# Patient Record
Sex: Female | Born: 1935 | Race: White | Hispanic: No | Marital: Married
Health system: Southern US, Community
[De-identification: ages and names within clinical notes are randomized; demographics above are authoritative.]

---

## 2005-12-13 ENCOUNTER — Emergency Department: Payer: Self-pay | Admitting: General Practice

## 2006-11-19 ENCOUNTER — Emergency Department: Payer: Self-pay | Admitting: Emergency Medicine

## 2006-11-19 ENCOUNTER — Other Ambulatory Visit: Payer: Self-pay

## 2007-05-04 ENCOUNTER — Other Ambulatory Visit: Payer: Self-pay

## 2007-05-05 ENCOUNTER — Inpatient Hospital Stay: Payer: Self-pay | Admitting: Internal Medicine

## 2007-07-11 ENCOUNTER — Emergency Department: Payer: Self-pay | Admitting: Emergency Medicine

## 2007-08-25 ENCOUNTER — Ambulatory Visit: Payer: Self-pay | Admitting: Internal Medicine

## 2007-09-24 ENCOUNTER — Ambulatory Visit: Payer: Self-pay | Admitting: Gastroenterology

## 2007-10-01 ENCOUNTER — Ambulatory Visit: Payer: Self-pay | Admitting: Gastroenterology

## 2008-01-26 ENCOUNTER — Ambulatory Visit: Payer: Self-pay | Admitting: Internal Medicine

## 2009-08-07 ENCOUNTER — Ambulatory Visit: Payer: Self-pay | Admitting: Internal Medicine

## 2012-04-10 LAB — CBC
MCH: 29 pg (ref 26.0–34.0)
MCHC: 33.1 g/dL (ref 32.0–36.0)
Platelet: 268 10*3/uL (ref 150–440)
RBC: 4.21 10*6/uL (ref 3.80–5.20)
RDW: 14.7 % — ABNORMAL HIGH (ref 11.5–14.5)
WBC: 9.4 10*3/uL (ref 3.6–11.0)

## 2012-04-10 LAB — URINALYSIS, COMPLETE
Ketone: NEGATIVE
Nitrite: NEGATIVE
RBC,UR: 3 /HPF (ref 0–5)
Specific Gravity: 1.01 (ref 1.003–1.030)

## 2012-04-10 LAB — COMPREHENSIVE METABOLIC PANEL
Albumin: 3.9 g/dL (ref 3.4–5.0)
Anion Gap: 9 (ref 7–16)
BUN: 14 mg/dL (ref 7–18)
Bilirubin,Total: 0.8 mg/dL (ref 0.2–1.0)
Calcium, Total: 9.2 mg/dL (ref 8.5–10.1)
Chloride: 105 mmol/L (ref 98–107)
Creatinine: 0.92 mg/dL (ref 0.60–1.30)
EGFR (Non-African Amer.): >60
Glucose: 89 mg/dL (ref 65–99)
Osmolality: 281 (ref 275–301)
SGOT(AST): 26 U/L (ref 15–37)

## 2012-04-10 LAB — TROPONIN I: Troponin-I: <0.02 ng/mL

## 2012-04-11 ENCOUNTER — Inpatient Hospital Stay: Payer: Self-pay | Admitting: Internal Medicine

## 2012-04-11 LAB — COMPREHENSIVE METABOLIC PANEL
Albumin: 3.1 g/dL — ABNORMAL LOW (ref 3.4–5.0)
Anion Gap: 7 (ref 7–16)
BUN: 10 mg/dL (ref 7–18)
Calcium, Total: 8 mg/dL — ABNORMAL LOW (ref 8.5–10.1)
Creatinine: 0.81 mg/dL (ref 0.60–1.30)
EGFR (Non-African Amer.): >60
Glucose: 119 mg/dL — ABNORMAL HIGH (ref 65–99)
Osmolality: 283 (ref 275–301)
SGOT(AST): 21 U/L (ref 15–37)
Sodium: 142 mmol/L (ref 136–145)
Total Protein: 6.2 g/dL — ABNORMAL LOW (ref 6.4–8.2)

## 2012-04-11 LAB — CBC
HCT: 32.5 % — ABNORMAL LOW (ref 35.0–47.0)
HGB: 10.1 g/dL — ABNORMAL LOW (ref 12.0–16.0)
MCHC: 31 g/dL — ABNORMAL LOW (ref 32.0–36.0)

## 2012-04-11 LAB — CK TOTAL AND CKMB (NOT AT ARMC)
CK, Total: 69 U/L (ref 21–215)
CK-MB: 1 ng/mL (ref 0.5–3.6)

## 2012-04-12 LAB — COMPREHENSIVE METABOLIC PANEL
Anion Gap: 5 — ABNORMAL LOW (ref 7–16)
BUN: 5 mg/dL — ABNORMAL LOW (ref 7–18)
Bilirubin,Total: 0.8 mg/dL (ref 0.2–1.0)
Calcium, Total: 9.4 mg/dL (ref 8.5–10.1)
Chloride: 107 mmol/L (ref 98–107)
Co2: 29 mmol/L (ref 21–32)
Creatinine: 0.88 mg/dL (ref 0.60–1.30)
EGFR (African American): >60
EGFR (Non-African Amer.): >60
Osmolality: 280 (ref 275–301)
Potassium: 4.6 mmol/L (ref 3.5–5.1)
SGPT (ALT): 26 U/L (ref 12–78)

## 2012-04-12 LAB — CBC WITH DIFFERENTIAL/PLATELET
Basophil %: 0.4 %
Eosinophil #: 0.1 10*3/uL (ref 0.0–0.7)
HCT: 40.6 % (ref 35.0–47.0)
HGB: 13 g/dL (ref 12.0–16.0)
Lymphocyte %: 26.5 %
MCHC: 31.9 g/dL — ABNORMAL LOW (ref 32.0–36.0)
MCV: 88 fL (ref 80–100)
Monocyte #: 0.7 x10 3/mm (ref 0.2–0.9)
Monocyte %: 9.1 %
Neutrophil %: 62.8 %
RBC: 4.6 10*6/uL (ref 3.80–5.20)
WBC: 8.1 10*3/uL (ref 3.6–11.0)

## 2012-04-13 LAB — COMPREHENSIVE METABOLIC PANEL
Albumin: 2.9 g/dL — ABNORMAL LOW (ref 3.4–5.0)
Alkaline Phosphatase: 77 U/L (ref 50–136)
Anion Gap: 8 (ref 7–16)
Bilirubin,Total: 0.6 mg/dL (ref 0.2–1.0)
Calcium, Total: 8.7 mg/dL (ref 8.5–10.1)
Creatinine: 0.97 mg/dL (ref 0.60–1.30)
EGFR (African American): >60
Osmolality: 275 (ref 275–301)
Potassium: 4.1 mmol/L (ref 3.5–5.1)
SGPT (ALT): 20 U/L (ref 12–78)
Sodium: 139 mmol/L (ref 136–145)
Total Protein: 6.5 g/dL (ref 6.4–8.2)

## 2012-04-15 LAB — AFP TUMOR MARKER: AFP-Tumor Marker: 5 ng/mL (ref 0.0–8.3)

## 2014-07-15 NOTE — H&P (Signed)
PATIENT NAME:  Monique Allen, Monique Allen MR#:  [id number] DATE OF BIRTH:  05/20/1935  DATE OF ADMISSION:  04/11/2012  PRIMARY CARE PHYSICIAN: Dr. Alonna BucklerAndrew Lamb.   REFERRING PHYSICIAN: Dr. Brien MatesBraud.   CHIEF COMPLAINT: Right severe abdominal pain.   HISTORY OF PRESENT ILLNESS: The patient is a 79 year old Caucasian female, who is a poor historian from chronic history of dementia, was brought into the ER with a chief complaint of right-sided abdominal pain by her husband. According to the husband, the husband reports to the ER physician the patient has been experiencing right-sided abdominal pain for the past 48 hours. He also reported to the ER physician that the patient developed significant lower abdominal pain on and off. This pain was associated with nausea and while she was sent over to the Radiology Department, she vomited. The patient being a poor historian, I was able to obtain somewhat little history from the patient. No family members were available at the bedside. Her lab work was in the normal range. The patient was given IV narcotics with a temporary relief of abdominal pain. The patient initially had a CAT scan of the abdomen and pelvis without contrast, which has revealed large hyperdensities in liver likely reflects cysts with the largest having dimensions of 15 cm transversely and 8.3 cm. They have recommended to get a CT scan of the abdomen with p.o. and IV contrast for more details. The patient eventually had a CAT scan of the abdomen and pelvis with the contrast, which has revealed a normal appendix and multiple cystic lesions in the liver. A surgical consult was placed by the ER physician and the patient was evaluated by Dr. Juliann PulseLundquist who thinks this is nonsurgical abdomen. And subsequently, he would follow the patient. The right upper quadrant ultrasound was ordered which is pending at this time. The patient is just complaining of right-sided abdominal pain, nausea, and vomited x 2. She has reported  that her last bowel movement was yesterday.   PAST MEDICAL HISTORY: Dementia, migraine headaches.   PAST SURGICAL HISTORY:  1. Hysterectomy.  2. Bladder surgery 3. Inguinal hernia repair.  ALLERGIES: ACCORDING TO OLD MEDICAL RECORDS THE PATIENT IS ALLERGIC TO FLAGYL.   HOME MEDICATION LIST:  1. Cipro 500 mg 1 tablet twice a day and I am not quite sure why the patient is on ciprofloxacin. 2. Aleve 220 mg once daily. 3. Acetaminophen 1 tablet p.o. 3 times a day as needed.   PSYCHOSOCIAL HISTORY: Lives at home with husband. According to old medical records, no smoking, alcohol, or illicit drug usage.   FAMILY HISTORY: Unobtainable as the patient is demented.  REVIEW OF SYSTEMS: Also unobtainable as the patient is a poor historian with a history of dementia.   PHYSICAL EXAMINATION:  VITAL SIGNS: Temperature 98.1, pulse 82, respirations 19, blood pressure  100/42.  GENERAL APPEARANCE: Not in acute distress, seems to be very uncomfortable from the abdominal pain, moderately built and moderately nourished.  HEENT: Normocephalic, atraumatic. Pupils are equally reacting to light and accommodation. No conjunctival injection, no congestion or sinus tenderness.  NECK: Supple. No JVD.  LUNGS: Clear to auscultation bilaterally.  CARDIAC: S1, S2 normal. Regular rate and rhythm. No murmurs.  GASTROINTESTINAL: Soft, hypoactive bowel sounds in all 4 quadrants. Severe tenderness is present in the right upper and lower abdominal areas. No rebound tenderness. No masses felt.  NEUROLOGIC: Awake and alert but oriented x 1 to 2. Reflexes are 2+.  SKIN: Warm. Normal turgor. No lesions. No rashes.  EXTREMITIES: No  edema. No cyanosis. No clubbing.  MUSCULOSKELETAL: No joint effusion or tenderness is present.   LABS AND IMAGING STUDIES: Glucose 89, BUN 14, creatinine 0.92, sodium 141, potassium 4.1, chloride 105, CO2 of 27, the GFR greater than 60, serum osmolality 281, calcium 9.2, magnesium 1.9, lipase 139.  LFTs are within normal range. Troponin T less than 0.02. WBC is 9.4, hemoglobin 12.2, hematocrit 36.9, platelet count 268,000.   URINALYSIS: Within normal limits, negative leuk esterase and negative nitrate. Serum lactic acid is 1.0.   A 12-lead EKG normal sinus rhythm at 82 beats per minute, normal PR interval, and normal QT.  A CAT scan of the abdomen and pelvis with contrast has revealed studies that are limited without IV and oral contrast, large hyperdensities and enlarged liver likely reflects cysts with the largest dimensions of 15 cm transversely and 8.3 cm AP and 13.8 cm in the dimension.  A fluid and gas collection just to the right of the midline in the mid abdomen that may reflect a large duodenal ulcer or small bowel diverticulum containing fluid and gas. No inflammatory changes around that and the pancreatic head is difficult to separate from distended presumed, but no pancreatic mass is demonstrated. The gallbladder is not clearly identified. A parapelvic cyst in the left kidney which are not new. There is a new, nonobstructing mid pole stone in the left kidney. The appendix is not discretely identified but no right lower quadrant inflammatory process is demonstrated.   CAT scan of the abdomen and pelvis with contrast has revealed a normal appendix, multiple cysts or lesions. The largest lesion has slightly increased in the size from the prior. It could  represent a cyst but there are a few areas of nonenhancing septations and nodularity in this lesion. Etiology such as biliary cystadenoma would be difficult to exclude.  The lesion in the posterior right hepatic lobe which contains some nodular areas of enhancement; however, it is similar in size to 2009.   Right upper quadrant ultrasound is ordered which is pending at this time.  ASSESSMENT AND PLAN: A 79 year old female with a chronic history of dementia presenting with severe abdominal pain, nausea and vomiting, will be admitted with  the following assessment and plan.  1.  Right abdominal pain with hypoactive bowel sounds, possible ileus.  2.  We will keep her n.p.o. We will provide D5/0.5 normal with 20 KCl at 125 mL/hour while the patient is n.p.o.  3.  Surgical consult is placed to Dr. Juliann Pulse. He is aware. 4.  A right upper quadrant ultrasound is ordered and the result is pending at this time. Morphine 2 mg IV q.4 hours as needed for pain. We will consider GI consult if there is no improvement of her abdominal pain as the patient has multiple liver cysts.  5.  Acute gastritis. We will give her n.p.o. and provide her IV fluids.  6.  IV proton pump inhibitor will be given.  7.  ? Cholecystitis. Right upper quadrant ultrasound is pending but all her LFTs are within normal range. 8.  A chronic history of dementia, the patient is a poor historian. The patient is n.p.o. right now.  9.  We will provide her GI and DVT prophylaxis.   Her CODE STATUS is going to be FULL CODE until the CODE STATUS is discussed with her husband. Currently no family members are available.   TOTAL TIME SPENT ON ADMISSION: 60 minutes.    ____________________________ Ramonita Lab, MD ag:jm D: 04/11/2012 01:51:21  ET T: 04/11/2012 14:38:45 ET JOB#: [id number]  cc: Ramonita Lab, MD, <Dictator> Christopher A. Juliann Pulse, MD Reola Mosher. Randa Lynn, MD Ramonita Lab MD ELECTRONICALLY SIGNED 04/25/2012 5:39

## 2014-07-15 NOTE — Consult Note (Signed)
Chief Complaint:   Subjective/Chief Complaint Still with RUQ pain radiating across, No new symptoms. Abdomen is tender in RUQ area without rebound. CT shows the previously seen cysts along with some new fluid beside liver. No signs of high grade small bowel obstruction  Recommendations: Agree with current management. [facility]. Will discuss with Dr. Allena KatzPatel who has been [facility].   Electronic Signatures: Lurline DelIftikhar, Alaiza Yau (MD)  (Signed 21-Jan-14 18:17)  Authored: Chief Complaint   Last Updated: 21-Jan-14 18:17 by Lurline DelIftikhar, Yuval Rubens (MD)

## 2014-07-15 NOTE — Consult Note (Signed)
Chief Complaint:   Subjective/Chief Complaint Feels better but still c/o RUQ pain.   VITAL SIGNS/ANCILLARY NOTES: **Vital Signs.:   20-Jan-14 14:08   Vital Signs Type Routine   Temperature Temperature (F) 98   Celsius 36.6   Temperature Source oral   Pulse Pulse 80   Respirations Respirations 20   Systolic BP Systolic BP 836   Diastolic BP (mmHg) Diastolic BP (mmHg) 78   Mean BP 96   Pulse Ox % Pulse Ox % 96   Pulse Ox Activity Level  At rest   Oxygen Delivery Room Air/ 21 %   Brief Assessment:   Additional Physical Exam Abdomen much less distended. Positive tenderness in RUQ area but overall abdomen is much less tender.   Lab Results: Hepatic:  20-Jan-14 07:06    Bilirubin, Total 0.6   Alkaline Phosphatase 77   SGPT (ALT) 20   SGOT (AST) 16   Total Protein, Serum 6.5   Albumin, Serum  2.9  Routine Chem:  20-Jan-14 07:06    Glucose, Serum  106   BUN  5   Creatinine (comp) 0.97   Sodium, Serum 139   Potassium, Serum 4.1   Chloride, Serum 106   CO2, Serum 25   Calcium (Total), Serum 8.7   Osmolality (calc) 275   eGFR (African American) >60   eGFR (Non-African American)  58 (eGFR values <78m/min/1.73 m2 may be an indication of chronic kidney disease (CKD). Calculated eGFR is useful in patients with stable renal function. The eGFR calculation will not be reliable in acutely ill patients when serum creatinine is changing rapidly. It is not useful in  patients on dialysis. The eGFR calculation may not be applicable to patients at the low and high extremes of body sizes, pregnant women, and vegetarians.)   Anion Gap 8   Radiology Results: XRay:    20-Jan-14 09:25, Abdomen Flat and Erect   Abdomen Flat and Erect    REASON FOR EXAM:    f/u abdominal distention  COMMENTS:       PROCEDURE: DXR - DXR ABDOMEN 2 V FLAT AND ERECT  - Apr 13 2012  9:25AM     RESULT: There is a small amount of residual contrast in the colon   predominantly in the descending colon and  rectum. No abnormal bowel   distention is evident. There is a trace amount of right pleural effusion.   There is no free air or abnormal bowel distention. There is some air in   nondistended loops of small bowel. Small air-fluid levels are present.    IMPRESSION:  Small air-fluid levels seen in the nondistended loops of   small bowel which appear to be less prominent than seen previously. No   bowel obstruction or perforation. Small right pleural effusion is present.  Dictation Site: 2        Verified By: GSundra Aland M.D., MD   Assessment/Plan:  Assessment/Plan:   Assessment Abdominal pain most likely secondary to large hepatic cyst. SBO is less likely.    Plan Agree with repeat CT. Further recommendations to follow.   Electronic Signatures: IJill Side(MD)  (Signed 20-Jan-14 17:36)  Authored: Chief Complaint, VITAL SIGNS/ANCILLARY NOTES, Brief Assessment, Lab Results, Radiology Results, Assessment/Plan   Last Updated: 20-Jan-14 17:36 by IJill Side(MD)

## 2014-07-15 NOTE — Consult Note (Signed)
Chief Complaint:   Subjective/Chief Complaint Was reported to be having severe abdominal pain without objective signs such as tachycardia etc. Now feels better and mostly c/o headaches. Abdomen is very soft and benign.  Impression; Large hepatic cyst with discomfort. No clinical signs of rupture or peritonitis.  Doubt high grade bowel obstruction.  Recommendations: [facility]. Plan discussed with Dr. Cherlynn KaiserSainani.   Electronic Signatures: Lurline DelIftikhar, Ambreen Tufte (MD)  (Signed 22-Jan-14 17:15)  Authored: Chief Complaint   Last Updated: 22-Jan-14 17:15 by Lurline DelIftikhar, Jalin Alicea (MD)

## 2014-07-15 NOTE — Consult Note (Signed)
PATIENT NAME:  Monique Allen, Monique Allen MR#:  [id number] DATE OF BIRTH:  06-01-1935  DATE OF CONSULTATION:  04/10/2012  REFERRING PHYSICIAN:    ATTENDING PHYSICIAN: Jisele Price A. Brindy Higginbotham, MD  REASON FOR CONSULT: Right upper quadrant pain x 2 days.   HISTORY OF PRESENT ILLNESS: The patient is a pleasant 79 year old female with a history of lower abdominal pain that she and her husband believes is due to a complication following a colonoscopy who presents with 2 days of right upper quadrant pain. She says that it began suddenly two days ago and has not improved. She describes that there is pain over her right upper quadrants, also some nausea, but no vomiting, has been having regular bowel movements, no fevers, chills, night sweats, shortness of breath, cough, chest pain, no dysuria or hematuria.   PAST MEDICAL HISTORY:  1. A history of multiple hepatic cysts.  2. A history of duodenal diverticulum.  3. A history of hysterectomy, total.  4. A history of bladder surgery.  5. A history of inguinal hernia repair. 6. Chronic abdominal pain.   HOME MEDICATIONS:  1. Ciprofloxacin 500 mg b.i.d. x 7 days.  2. Aleve 220 mg p.o.  3. Percocet 1 tab p.o. t.i.d. p.r.n. pain.   ALLERGIES: FLAGYL.   SOCIAL HISTORY: Wheelchair-bound, does not smoke, does not drink, is here with husband.   FAMILY HISTORY: Unobtainable. The patient is slightly lethargic.   REVIEW OF SYSTEMS: A total 12-point review of systems was obtained. Pertinent positives and negatives as above.    PHYSICAL EXAMINATION:  VITAL SIGNS: Temperature 98.1, pulse 81, blood pressure 105/51, respirations 19, 91% on room air.  GENERAL: No acute distress. Alert and oriented x 3 although appears to be somewhat somnolent.  HEAD: Normocephalic, atraumatic.  EYES: No scleral icterus. No conjunctivitis.  CHEST: Lungs clear to auscultation, moving air well.  HEART: Regular rate and rhythm. No murmurs, rubs, or gallops.  ABDOMEN: Soft, bowel  distention, mildly tender right upper quadrant. No peritonitis, no guarding. No rebound.  EXTREMITIES: Moves all extremities well. Strength 5 out of 5.  NEUROLOGIC: The cranial nerves II through XII grossly intact.   LABORATORY, RADIOLOGICAL AND DIAGNOSTIC DATA: From 1:00 today and are as follows: Significant for a white cell count of 9.5, hemoglobin 12.2, hematocrit 36.9, platelets are 268. Renal panel is unremarkable. Bicarb is 27, creatinine is 0.92. LFTs are unremarkable. Lipase is 139, lactate is 1.0.   IMAGING: I have personally reviewed the images from the patient. She was noted to have a large, multiple fluid filled hepatic masses and low attenuation lesions which are likely cysts. The largest cyst 15 x 9 cm which has increased. There is some septation nodularity. There is no enhancement indicating obvious bleed. No blush. Also has a low-attenuation lesion in the posterior right hepatic lobe, which similar to prior study in 2009. There is an oral contrast and a large, probable duodenal diverticulum, also has a significant amount of stool burden, has diverticulosis of the sigmoid and ascending colon, the appendix is normal.   ASSESSMENT AND PLAN: The patient is a pleasant 79 year old female with a history of chronic abdominal pain and now has acute onset of right upper quadrant pain. Does have a large cyst which may be symptomatic. There are no obvious beliefs that these are bleeding, that she is bleeding into her cyst, and is hemodynamically stable, although albeit slightly hypotensive, also has a duodenal diverticulum which not show any obvious paradiverticular stranding indicating inflammation or duodenal diverticulitis. Also has significant  stool burden in her cecum and this could be secondary to some constipation. She says she has felt constipated and she is bloated.      No immediate obvious surgical intervention necessary. We will continue to follow closely. The patient was  admitted.   ____________________________ Si Raider Bernie Fobes, MD cal:jm D: 04/10/2012 23:14:00 ET T: 04/11/2012 15:41:54 ET JOB#: [id number]  cc: Cristal Deer A. Brynley Cuddeback, MD, <Dictator> Jarvis Newcomer MD ELECTRONICALLY SIGNED 04/14/2012 9:09

## 2014-07-15 NOTE — Consult Note (Signed)
Chief Complaint:   Subjective/Chief Complaint Abdominal xrays reviewed. Concerning for deceloping SBO which would explain increasing abdominal pain, tenderness and distension although hepatic cyst remains a possible explanation. Discussed with Dr. Anda KraftMarterre and they will re-evaluate her today. Will follow. Discussed with Dr. Allena KatzPatel.   Electronic Signatures: Lurline DelIftikhar, Cristiana Yochim (MD)  (Signed 19-Jan-14 17:08)  Authored: Chief Complaint   Last Updated: 19-Jan-14 17:08 by Lurline DelIftikhar, Deckard Stuber (MD)

## 2014-07-15 NOTE — Consult Note (Signed)
PATIENT NAME:  Monique Allen, Monique Allen MR#:  [id number] DATE OF BIRTH:  1935/12/22  DATE OF CONSULTATION:  04/22/2012  CONSULTING PHYSICIAN:  Lurline DelShaukat Amir Glaus, MD  REASON FOR CONSULTATION: Severe abdominal pain.   HISTORY OF PRESENT ILLNESS: A 79 year old Caucasian female history of dementia. The patient came in on January 18, brought to the Emergency Room by her husband with severe right-sided abdominal pain. When initially evaluated, she was quite sedated because of Ativan. Later on, the patient was able to give some history. According to her, she has been having the abdominal pain in the right upper quadrant area on and off and has being [facility]. Apparently the pain got worse within the last 48 hours and therefore she was brought to the Emergency Room. There was some associated nausea and vomiting as well. CT scan of the abdomen showed multiple hepatic cysts, one of them very large at about 14 cm. It appears that that cyst has been enlarged significantly since the last CT scan of 2009. The patient was also complaining of constipation. No diarrhea was reported. No other significant symptoms such as fever, chills, etc. The patient was evaluated by surgery and then subsequently GI was consulted.   PAST MEDICAL HISTORY: Migraines and dementia.   PAST SURGICAL HISTORY: Hysterectomy, bladder surgery, inguinal hernia repair.   ALLERGIES:  FLAGYL.   HOME MEDICATIONS:  Include Cipro and Tylenol.   SOCIAL HISTORY:  Does not smoke or drink.   FAMILY HISTORY: Unremarkable.   REVIEW OF SYSTEMS: Negative except for what is mentioned in the history of present illness.   PHYSICAL EXAMINATION: GENERAL: Elderly, demented female. She was afebrile. Heart rate was in the 80s and 90s, respiration 18 to 20, blood pressure about 120/70. Clinically did not appear to be jaundiced.   NECK:  Veins are flat.  LUNGS: Clear to auscultation bilaterally with fair air entry. No added sounds.   CARDIOVASCULAR: Regular rate and rhythm.  ABDOMEN: Showed somewhat distended abdomen. Significant tenderness was noted in the right upper quadrant area, although there was no rebound or guarding. Bowel sounds were sluggish. The rest of the abdominal examination unremarkable.  NEUROLOGIC: Nonfocal.   LABORATORY DATA: Hemoglobin about 12. White cell count normal at 9. Platelet count was normal at 224. Electrolytes normal. Liver enzymes normal.  CT scan as above.   ASSESSMENT AND PLAN: The patient with upper abdominal pain most likely secondary to enlarging right hepatic cyst. The case was discussed with Dr. Allena KatzPatel and recommendations were made for the [facility] to [facility] and [facility]. Meanwhile, continue to control back pain with pain medicines. During hospitalization, there was concern about possible small bowel obstruction as well due to abdominal distention and diffuse abdominal pain. Surgery was reconsulted and a repeat CT scan was not consistent with a small bowel obstruction. The [facility] was subsequently [facility] [place] as recommended and further [facility] [place] [facility].      ____________________________ Lurline DelShaukat Jelene Albano, MD si:ce D: 04/22/2012 11:33:07 ET T: 04/22/2012 11:50:18 ET JOB#: [id number]  cc: Lurline DelShaukat Asiah Befort, MD, <Dictator>

## 2014-07-15 NOTE — Consult Note (Signed)
PATIENT NAME:  Monique Allen, Monique Allen MR#:  [id number] DATE OF BIRTH:  April 04, 1935  DATE OF CONSULTATION:  04/15/2012  CONSULTING PHYSICIAN:  Lurline DelShaukat Kimberly Coye, MD  REASON FOR CONSULTATION: Severe abdominal pain.   HISTORY OF PRESENT ILLNESS: A 79 year old Caucasian female history of dementia. The patient came in on January 18, brought to the Emergency Room by her husband with severe right-sided abdominal pain. When initially evaluated, she was quite sedated because of Ativan. Later on, the patient was able to give some history. According to her, she has been having the abdominal pain in the right upper quadrant area on and off and has [facility]. Apparently the pain got worse within the last 48 hours and therefore she was brought to the Emergency Room. There was some associated nausea and vomiting as well. CT scan of the abdomen showed multiple hepatic cysts, one of them very large at about 14 cm. It appears that that cyst has been enlarged significantly since the last CT scan of 2009. The patient was also complaining of constipation. No diarrhea was reported. No other significant symptoms such as fever, chills, etc. The patient was evaluated by surgery and then subsequently GI was consulted.   PAST MEDICAL HISTORY: Migraines and dementia.   PAST SURGICAL HISTORY: Hysterectomy, bladder surgery, inguinal hernia repair.   ALLERGIES:  FLAGYL.   HOME MEDICATIONS:  Include Cipro and Tylenol.   SOCIAL HISTORY:  Does not smoke or drink.   FAMILY HISTORY: Unremarkable.   REVIEW OF SYSTEMS: Negative except for what is mentioned in the history of present illness.   PHYSICAL EXAMINATION: GENERAL: Elderly, demented female. She was afebrile. Heart rate was in the 80s and 90s, respiration 18 to 20, blood pressure about 120/70. Clinically did not appear to be jaundiced.   NECK:  Veins are flat.  LUNGS: Clear to auscultation bilaterally with fair air entry. No added sounds.   CARDIOVASCULAR: Regular rate and rhythm.  ABDOMEN: Showed somewhat distended abdomen. Significant tenderness was noted in the right upper quadrant area, although there was no rebound or guarding. Bowel sounds were sluggish. The rest of the abdominal examination unremarkable.  NEUROLOGIC: Nonfocal.   LABORATORY DATA: Hemoglobin about 12. White cell count normal at 9. Platelet count was normal at 224. Electrolytes normal. Liver enzymes normal.  CT scan as above.   ASSESSMENT AND PLAN: The patient with upper abdominal pain most likely secondary to enlarging right hepatic cyst. The case was discussed with Dr. Allena KatzPatel and recommendations were made for the [facility] to [facility] and [facility]. Meanwhile, continue to control back pain with pain medicines. During hospitalization, there was concern about possible small bowel obstruction as well due to abdominal distention and diffuse abdominal pain. Surgery was reconsulted and a repeat CT scan was not consistent with a small bowel obstruction. The [facility] was subsequently [facility] [place] as recommended and [facility] [place] [facility].   ____________________________ Lurline DelShaukat Halla Chopp, MD si:ce D: 04/22/2012 11:33:00 ET T: 04/22/2012 11:50:18 ET JOB#: [id number]  cc: Lurline DelShaukat Pierrette Scheu, MD, <Dictator> Lurline DelSHAUKAT Elfrida Pixley MD ELECTRONICALLY SIGNED 05/01/2012 12:56

## 2014-07-15 NOTE — Discharge Summary (Signed)
PATIENT NAME:  Monique Allen, Monique Allen MR#:  [id number] DATE OF BIRTH:  11-08-1935  DATE OF ADMISSION:  04/11/2012 ANTICIPATED DATE OF TRANSFER:  04/12/2912  ADMITTING DIAGNOSIS: Right upper quadrant abdominal pain.   DISCHARGE DIAGNOSES: 1.  Right upper quadrant abdominal pain, possibly related to expanding hepatic cyst, also has gallstones. No evidence of cholecystitis.  2.  Progressive liver cyst increasing in size.  3.  Duodenal diverticulum.  4.  Constipation.  5.  Mild dementia with acute agitation, now resolved.  6.  Possible gastritis currently on proton pump inhibitors. 7.  Chronic right lower quadrant abdominal pain.  8.  History of migraine headaches. 9.  Status post hysterectomy.  10.  Status post bladder surgery.  11.  Status post inguinal hernia repair.  CONSULTANT: Surgery, Ida Roguehristopher Lundquist, MD, Lurline DelShaukat Iftikhar, MD  PERTINENT LABORATORY AND DIAGNOSTIC DATA: EKG on admission:  Normal sinus rhythm. Troponin less than 0.02, magnesium 1.9, lipase 139. BMP: Glucose 89, BUN 14, creatinine 0.92, sodium 141, potassium 4.1, chloride 105, CO2 27. LFTs were normal with ALT of 29, AST of 26. Her UA was negative, nitrites negative, leukocytes negative. Lactic acid 1. CT scan of the abdomen and pelvis showed a large hypodensity within a large liver, likely reflects cyst, the largest cystic lesion measuring 15 cm x 8.3 cm, also fluid and gas collection just to the midline in the mid abdomen may reflect a large duodenal small bowel diverticulum fluid and gas. There are no inflammatory changes.  It is increased in size compared to 79  Appendix appears to be normal. Ultrasound of the abdomen shows again small adherent gallstones versus gallbladder polyp. No sonographic evidence of acute cholecystitis.   HOSPITAL COURSE: Please refer to H and P done by the admitting physician. The patient is a 79 year old white female who has had a history of liver cyst in the past who does have some chronic  left lower quadrant abdominal discomfort who  comes in with progressive abdominal pain for the past few weeks in the right upper quadrant, severe in nature. The patient was seen in the ED initially and evaluated by surgery.  They did not feel that she had surgical need. She had a CT scan of the abdomen which demonstrated a very large liver cyst, increased in size compared to her previous CT done a few years prior. She had been having these symptoms for the past few weeks and did not seek medical attention. [facility]. Has not been followed since. It is felt that if her symptoms are due to the cystic liver cysts, then she might need to have these drained as well as opened up.  Currently we are not able to identify the cause for her symptoms. Therefore, [facility] are being [facility]. [facility], we will await her transfer.   CURRENT MEDICATIONS: Lovenox 40 mg subcutaneous daily. Zofran 4 mg IV q.6 p.r.n. Protonix 40 q.6 a.m.   DIET: Clear liquid as tolerated.   Total time spent:  35 minutes.    ____________________________ Lacie ScottsShreyang H. Allena KatzPatel, MD shp:ct D: 04/12/2012 13:59:00 ET T: 04/12/2012 14:20:59 ET JOB#: [id number]  cc: Ilyana Manuele H. Allena KatzPatel, MD, <Dictator> Charise CarwinSHREYANG H Oreoluwa Gilmer MD ELECTRONICALLY SIGNED 04/15/2012 11:30

## 2014-07-15 NOTE — Discharge Summary (Signed)
PATIENT NAME:  Monique Allen, Monique Allen MR#:  [id number] DATE OF BIRTH:  [birth year]  DATE OF ADMISSION:  04/11/2012 DATE OF DISCHARGE:  04/15/2012  For a detailed note, please take a look at the history and physical done on admission by Dr. Amado CoeGouru. Please also look at the interim discharge summary done by Dr. Auburn BilberryShreyang Patel which covers hospital course from admission on January 18th until 04/14/2012. This covers a short interim hospital course from January 2nd, on the day that she was discharged.   DISCHARGE DIAGNOSES:  1. Right upper quadrant abdominal pain possibly related to expanding hepatic cyst.  2. Progressive liver cyst which is increasing in size. 3. History of constipation.  4. Mild dementia with acute agitation.  5. Possible gastritis. 6. History of chronic migraines. 7. Status post hysterectomy. 8. Status post bladder surgery. 9. Status post inguinal hernia repair.   CONSULTANTS: 1. Lurline DelShaukat Iftikhar, MD - Gastroenterology. 2. Dionne Miloichard Cooper, MD / Ida Roguehristopher Lundquist, MD - General Surgery.   PERTINENT STUDIES DURING INTERIM HOSPITAL COURSE: Repeat CT scan of the abdomen and pelvis done with contrast, on 04/14/2012, showed abnormal mild prominent loops of small bowel with small air fluid levels to which contrast passes into distal small bowel and colon. Differential considerations are possible small bowel obstruction versus ileus. New perihepatic fluid. There is a small amount of fluid in the pelvis. There is new small right pleural effusion with some compressive atelectasis. Enlarging abnormal cystic mass in the liver with some enhancement along the anterior and superior aspect. This is concerning and atypical cystadenoma are primary considerations. Septations and large cystic mass are not smooth and irregular but are more nodular. There is mild intrahepatic ductal dilatation. Appendix appears normal. Stable large parapelvic cysts.   BRIEF INTERIM HOSPITAL COURSE: This is a 79 year old  female with past medical history of dementia and migraines who presented to the hospital with severe right upper quadrant pain.  1. Severe right upper quadrant pain. The exact etiology was unclear but thought to be related to the large complex hepatic cyst with some perihepatic fluid. The patient was being followed by GI and also general surgery. As mentioned, the repeat CT scan did show some early small bowel obstruction, although as per surgery they did not want to perform any acute surgical intervention at this time as the contrast was passing all the way through the colon. As far as the hepatic cyst was concerned, GI was following the patient and had ordered some basic testing like alpha-fetoprotein, which was normal. The patient was requiring high doses of narcotics to keep her pain under control and since repeat CT showed some perihepatic fluid there was a concern that the cyst was leaking out in the peritoneum causing peritonitis. GI recommended transferring the patient to a tertiary care center. The [facility]  [facility] pending bed availability. After speaking to the husband, I did call [facility] to see if we could get her transferred there. The patient was therefore [facility] for further evaluation of her severe right upper quadrant pain possibly related to peritonitis from a ruptured hepatic cyst. In the meantime, we were continuing IV fluids and pain control and clear liquid diet as tolerated.  2. Dementia with agitation. The patient appeared very restless and agitated. On the day of transfer, I had to give her some Dilaudid and even some Haldol for the agitation. This further needs to be followed.  3.  Gastritis. The patient was placed on proton pump inhibitors and she will continue that.   DISCHARGE MEDICATIONS: 1. Colace 100 mg 2 times a day. 2. Zofran 4 mg q. 4 to 6 hours as needed. 3. Morphine 2 mg IV q. 4 hours  as needed. 4. Protonix 40 mg daily. 5. MiraLAX daily as needed.             DISPOSITION: Transfer to [facility] for further evaluation of right upper quadrant abdominal pain and a possible complex hepatic cyst with peritonitis.  TIME SPENT: 40 minutes.  ____________________________ Rolly Pancake. Cherlynn Kaiser, MD vjs:sb D: 04/21/2012 14:01:00 ET T: 04/21/2012 14:42:27 ET JOB#: [id number]  cc: Rolly Pancake. Cherlynn Kaiser, MD, <Dictator> Houston Siren MD ELECTRONICALLY SIGNED 04/22/2012 15:34

## 2014-07-15 NOTE — Consult Note (Signed)
Brief Consult Note: Diagnosis: Abdominal pain.   Patient was seen by consultant.   Discussed with Attending MD.   Comments: Patient very sleepy due to Ativan. History of RUQ pain. US showed small gallstones without signs of cholecystitis. WBC count normal. No fever. CT with multiple hepatic cysts, one very large and apparently has grown since 2009. Positive duodenal diverticulum without signs of perforation or inflammation.  Impression: RUQ pain most likely secondary to enlarging hepatic cyst and less likely related to gallstones or duodenal diverticulum.  Recommendations: Symptomatic treatment and OP follow [facility] for further management of large hepatic cyst.  Electronic Signatures: Lurline DelIftikhar, Shamirah Ivan (MD)  (Signed 18-Jan-14 13:33)  Authored: Brief Consult Note   Last Updated: 18-Jan-14 13:33 by Lurline DelIftikhar, Gared Gillie (MD)

## 2014-07-15 NOTE — Consult Note (Signed)
Chief Complaint:   Subjective/Chief Complaint Ptaient is c/o "severe" abdominal pain over right half of abdomen which is chronic but much worse according to her. No vomiting. Three loose BM's according to her.   VITAL SIGNS/ANCILLARY NOTES: **Vital Signs.:   19-Jan-14 06:23   Vital Signs Type Routine   Temperature Temperature (F) 97.7   Celsius 36.5   Temperature Source oral   Respirations Respirations 20   Systolic BP Systolic BP 300   Diastolic BP (mmHg) Diastolic BP (mmHg) 74   Mean BP 96   Pulse Ox % Pulse Ox % 94   Pulse Ox Activity Level  At rest   Oxygen Delivery Room Air/ 21 %   Brief Assessment:   Additional Physical Exam Abdomen is distended. Tenderness noted mainly over right half of abdomen. No rebound. Bowel sounds are sluggish.   Lab Results: Hepatic:  19-Jan-14 04:05    Bilirubin, Total 0.8   Alkaline Phosphatase 101   SGPT (ALT) 26   SGOT (AST) 25   Total Protein, Serum 7.7   Albumin, Serum 3.7  Routine Chem:  19-Jan-14 04:05    Glucose, Serum  118   BUN  5   Creatinine (comp) 0.88   Sodium, Serum 141   Potassium, Serum 4.6   Chloride, Serum 107   CO2, Serum 29   Calcium (Total), Serum 9.4   Osmolality (calc) 280   eGFR (African American) >60   eGFR (Non-African American) >60 (eGFR values <105m/min/1.73 m2 may be an indication of chronic kidney disease (CKD). Calculated eGFR is useful in patients with stable renal function. The eGFR calculation will not be reliable in acutely ill patients when serum creatinine is changing rapidly. It is not useful in  patients on dialysis. The eGFR calculation may not be applicable to patients at the low and high extremes of body sizes, pregnant women, and vegetarians.)   Anion Gap  5  Routine Hem:  19-Jan-14 04:05    WBC (CBC) 8.1   RBC (CBC) 4.60   Hemoglobin (CBC) 13.0   Hematocrit (CBC) 40.6   Platelet Count (CBC) 317   MCV 88   MCH 28.2   MCHC  31.9   RDW  15.1   Neutrophil % 62.8   Lymphocyte %  26.5   Monocyte % 9.1   Eosinophil % 1.2   Basophil % 0.4   Neutrophil # 5.1   Lymphocyte # 2.2   Monocyte # 0.7   Eosinophil # 0.1   Basophil # 0.0 (Result(s) reported on 12 Apr 2012 at 05:19AM.)   Assessment/Plan:  Assessment/Plan:   Assessment Abdominal pain which is chronic but much worse lately. Etiology is not clear although CT showed a very large hepatic cyst which has increased since 2009 but it is not clear if this is the reason for increased pain. Gallstones without signs of cholecystitis. Probable duodenal diverticulum reported as unchanged since 2009. Diarrhea raising concerns about colitis although CT was unremarkable except for constipation.    Plan Continue pain control. Stool for C. diff. Abdominal x-rays to r/o any significant change since CT scan. [facility] Discussed with Dr. PPosey Pronto   Electronic Signatures: IJill Side(MD)  (Signed 19-Jan-14 10:37)  Authored: Chief Complaint, VITAL SIGNS/ANCILLARY NOTES, Brief Assessment, Lab Results, Assessment/Plan   Last Updated: 19-Jan-14 10:37 by IJill Side(MD)

## 2014-09-05 IMAGING — CR DG ABDOMEN 3V
1 series · 3 of 3 positions shown · non-contrast
Comparison: none

REASON FOR EXAM: Abdominal pain
COMMENTS:

PROCEDURE:     DXR - DXR ABDOMEN COMPLETE  - April 12, 2012 [DATE]
RESULT:     Comparison: CT of the abdomen and pelvis 04/10/2012

[Series 1: w abdomen upright · 0.14mm/px · 3 of 3 slices shown]
[im 1/3]
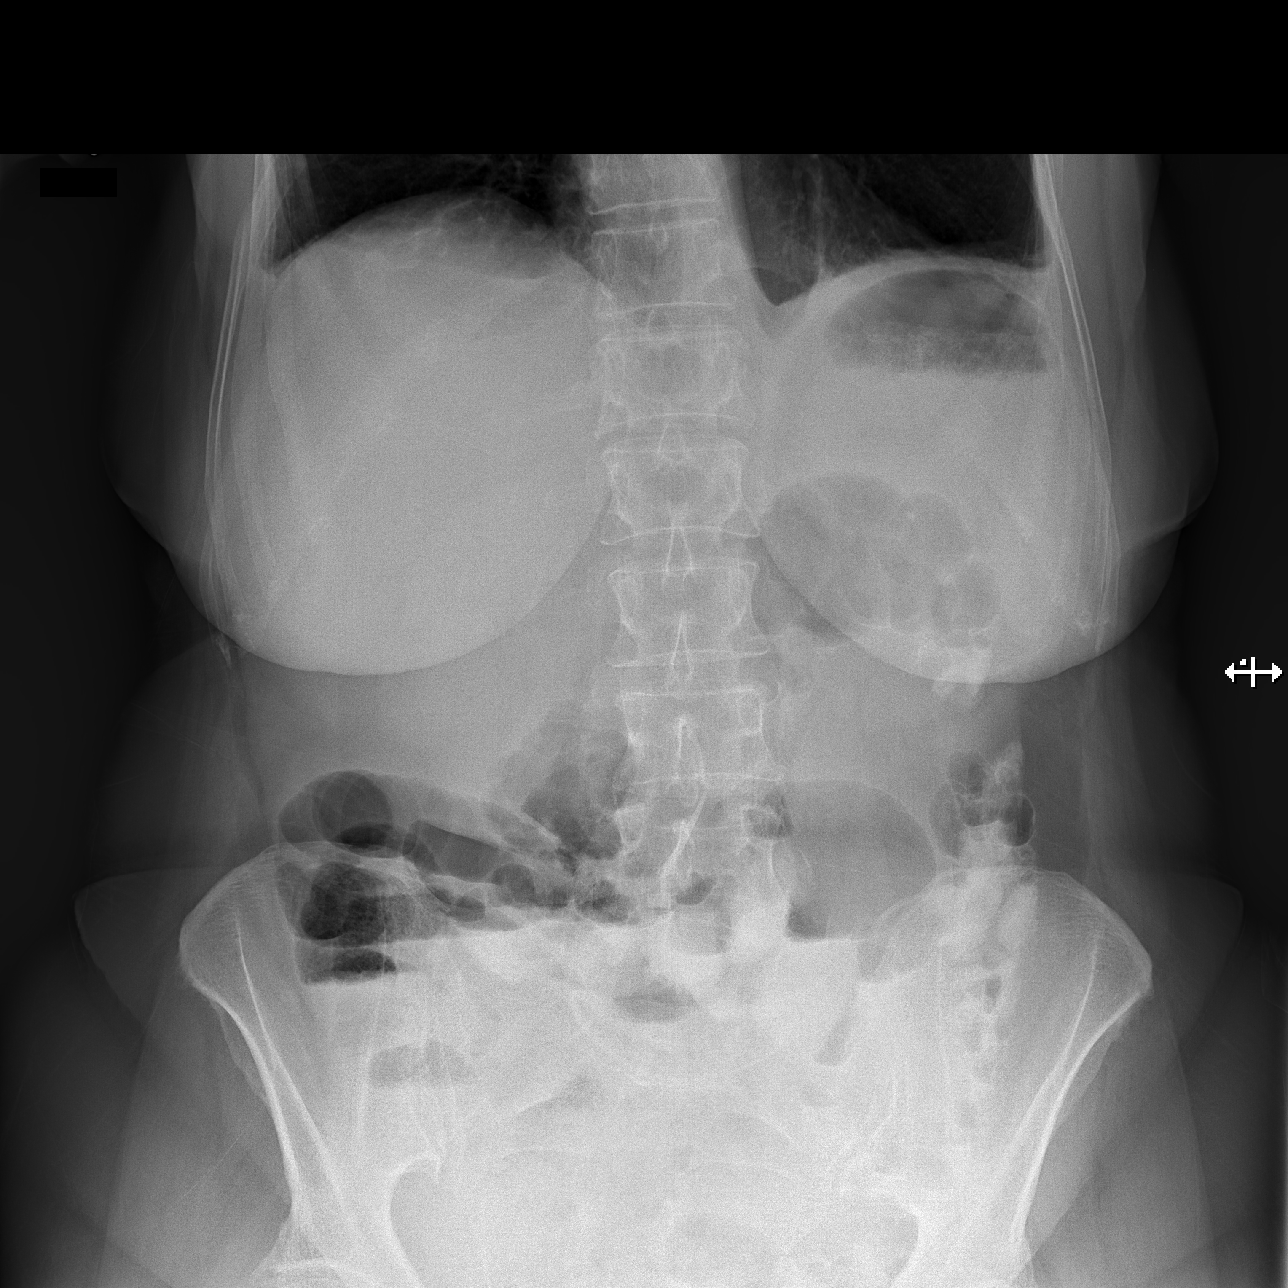
[im 2/3]
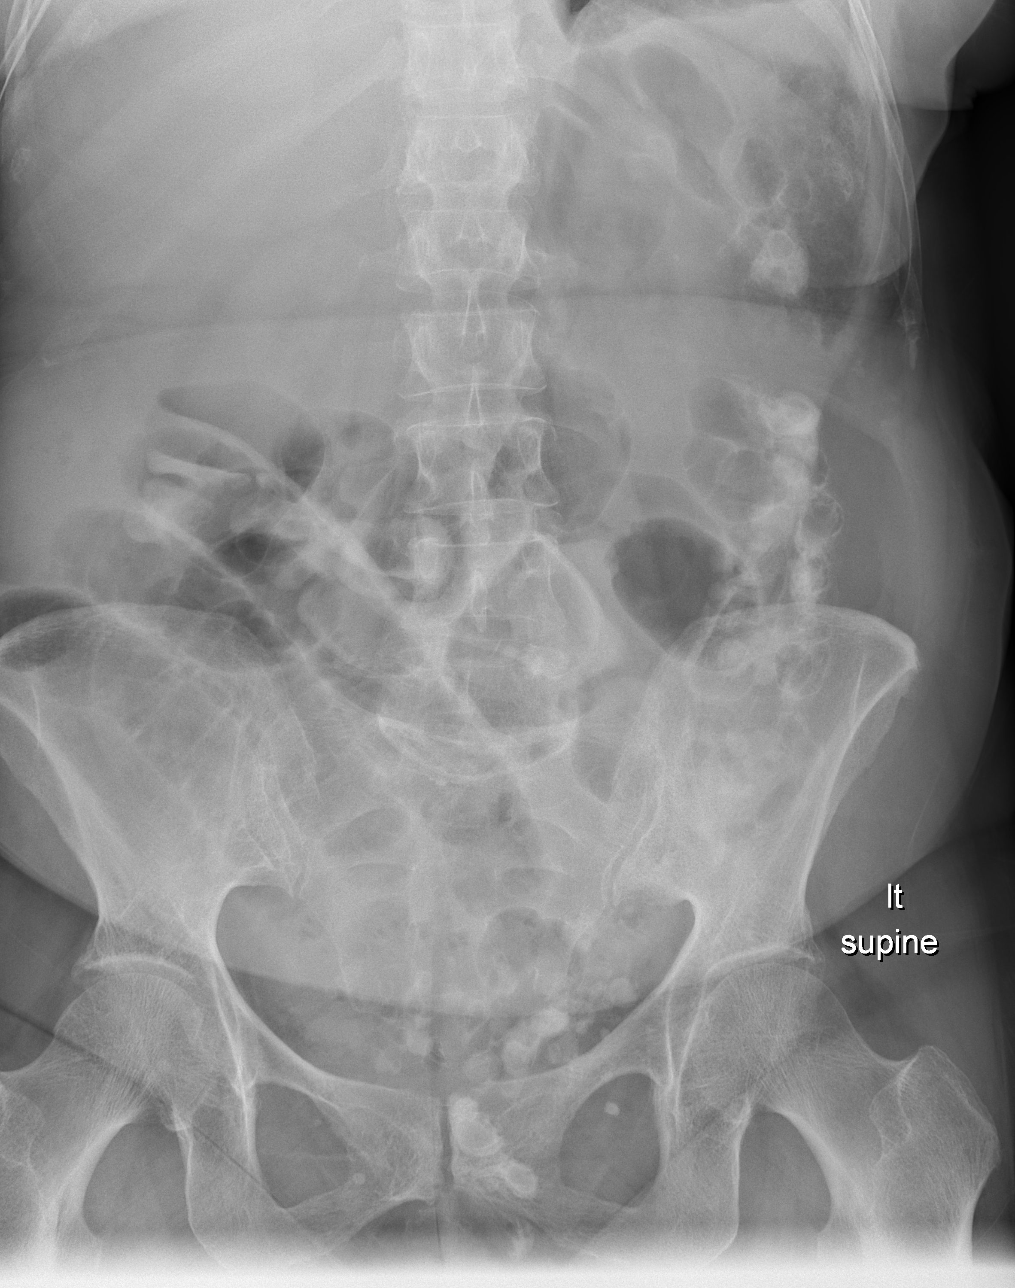
[im 3/3]
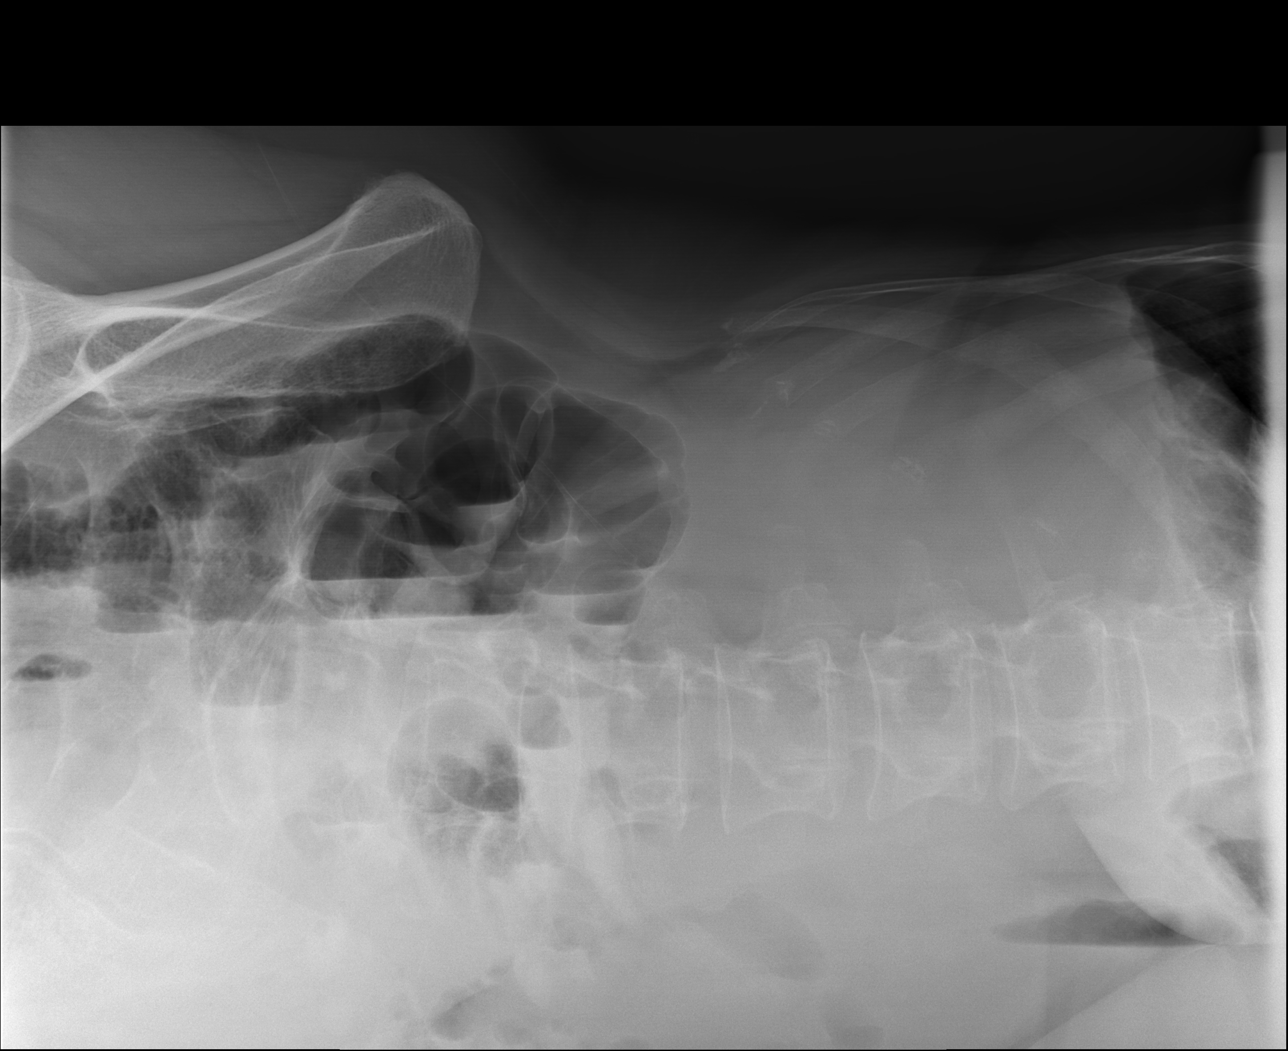

[3 of 3 positions shown; findings below may reference images not displayed]

FINDINGS: No free intraperitoneal air. There are likely trace bilateral pleural
effusions. Oral contrast material is seen within the nondilated colon. There
are several mildly dilated loops of air-filled small bowel. There are
several air-fluid levels on the decubitus view.
IMPRESSION: Findings which may represent small bowel ileus versus early/partial small
bowel obstruction. The small bowel dilatation is new from the recent prior
CT.

[REDACTED]
